# Patient Record
Sex: Female | Born: 2000 | Race: Black or African American | Hispanic: No | Marital: Single | Smoking: Never smoker
Health system: Southern US, Community
[De-identification: ages and names within clinical notes are randomized; demographics above are authoritative.]

## PROBLEM LIST (undated history)

## (undated) DIAGNOSIS — J302 Other seasonal allergic rhinitis: Secondary | ICD-10-CM

## (undated) DIAGNOSIS — J45990 Exercise induced bronchospasm: Secondary | ICD-10-CM

---
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
## 2022-05-23 DIAGNOSIS — G9349 Other encephalopathy: Secondary | ICD-10-CM

## 2022-05-23 HISTORY — DX: Other encephalopathy: G93.49

## 2024-06-21 ENCOUNTER — Other Ambulatory Visit: Payer: Self-pay

## 2024-06-21 ENCOUNTER — Encounter (HOSPITAL_COMMUNITY): Payer: Self-pay

## 2024-06-21 ENCOUNTER — Emergency Department (HOSPITAL_COMMUNITY)
Admission: EM | Admit: 2024-06-21 | Discharge: 2024-06-21 | Disposition: A | Discharge status: Home / Self Care | Attending: Emergency Medicine | Admitting: Emergency Medicine

## 2024-06-21 DIAGNOSIS — R200 Anesthesia of skin: Secondary | ICD-10-CM | POA: Insufficient documentation

## 2024-06-21 DIAGNOSIS — Z91040 Latex allergy status: Secondary | ICD-10-CM | POA: Insufficient documentation

## 2024-06-21 NOTE — ED Provider Notes (Signed)
 " [facility] AT [facility] Provider Note   CSN: [id number] Arrival date & time: 06/21/24  8057     Patient presents with: Numbness   Barbara Collier is a 24 y.o. female.   HPI   Patient has a history of asthma and Susak syndrome.  Patient has been followed by neurology at [facility].  Patient has MRIs confirming her condition.  She has been treated with steroids and IVIG treatment.  Patient states she has had 2 episodes of having numbness on the left side.  Patient states initially was her left arm.  She subsequently had another episode that involved the left arm face and then also down into her leg.  The symptoms have all resolved at this point.  Patient does not have any difficulty walking.  No headache.  No fevers or chills  Prior to Admission medications  Medication Sig Start Date End Date Taking? Authorizing Provider  albuterol  (PROVENTIL  HFA;VENTOLIN  HFA) 108 (90 BASE) MCG/ACT inhaler Inhale 2 puffs into the lungs 4 (four) times daily. 06/21/14   Sonda Alm BROCKS, MD  ibuprofen  (ADVIL ,MOTRIN ) 400 MG tablet Take 1 tablet (400 mg total) by mouth every 6 (six) hours as needed for fever. 08/07/11   Mortenson, Ashley, MD  lamoTRIgine (LAMICTAL) 25 MG tablet Take 25 mg by mouth daily.    [provider]  naproxen  (NAPROSYN ) 500 MG tablet Take 1 tablet (500 mg total) by mouth 2 (two) times daily. 06/21/14   Sonda Alm BROCKS, MD  predniSONE  (DELTASONE ) 20 MG tablet Take 1 tablet (20 mg total) by mouth 2 (two) times daily. Patient not taking: Reported on 12/19/2015 06/21/14   Sonda Alm BROCKS, MD  Pseudoephedrine -Guaifenesin  (MUCINEX  D) [phone] MG TB12 Take 1 tablet by mouth 2 (two) times daily. Patient not taking: Reported on 12/19/2015 08/07/11   Mortenson, Ashley, MD  traMADol  (ULTRAM ) 50 MG tablet 1 to 2 tablets every 8 hours as needed for cough. 06/21/14   Sonda Alm BROCKS, MD    Allergies: Latex    Review of Systems  Updated Vital Signs BP 122/70  (BP Location: Left Arm)   Pulse 77   Temp 99.3 F (37.4 C) (Oral)   Resp 19   Ht 1.6 m (5' 3)   Wt 79.4 kg   SpO2 99%   BMI 31.00 kg/m   Physical Exam Vitals and nursing note reviewed.  Constitutional:      General: She is not in acute distress.    Appearance: She is well-developed.  HENT:     Head: Normocephalic and atraumatic.     Right Ear: External ear normal.     Left Ear: External ear normal.  Eyes:     General: No visual field deficit or scleral icterus.       Right eye: No discharge.        Left eye: No discharge.     Conjunctiva/sclera: Conjunctivae normal.  Neck:     Trachea: No tracheal deviation.  Cardiovascular:     Rate and Rhythm: Normal rate and regular rhythm.  Pulmonary:     Effort: Pulmonary effort is normal. No respiratory distress.     Breath sounds: Normal breath sounds. No stridor. No wheezing or rales.  Abdominal:     General: Bowel sounds are normal. There is no distension.     Palpations: Abdomen is soft.     Tenderness: There is no abdominal tenderness. There is no guarding or rebound.  Musculoskeletal:  General: No tenderness or deformity.     Cervical back: Neck supple.  Skin:    General: Skin is warm and dry.     Findings: No rash.  Neurological:     General: No focal deficit present.     Mental Status: She is alert and oriented to person, place, and time.     Cranial Nerves: No cranial nerve deficit, dysarthria or facial asymmetry.     Sensory: No sensory deficit.     Motor: No abnormal muscle tone, seizure activity or pronator drift.     Coordination: Coordination normal.     Comments:  able to hold both legs off bed for 5 seconds, sensation intact in all extremities,  no left or right sided neglect, normal finger-nose exam bilaterally, no nystagmus noted   Psychiatric:        Mood and Affect: Mood normal.     (all labs ordered are listed, but only abnormal results are displayed) Labs Reviewed - No data to  display  EKG: None  Radiology: No results found.   Procedures   Medications Ordered in the ED - No data to display                                  Medical Decision Making  Patient has history of Susak syndrome.  With her complaints of numbness and tingling on the left side was concerned about the possibility of exacerbation of her condition.  I discussed with the patient about proceeding with MRI and laboratory testing for further evaluation.  Patient informed me that she was feeling better and did not want to have any further testing done.  Explained to her that if she changes her mind we certainly can proceed with lab testing and MRI today.     Final diagnoses:  Left sided numbness    ED Discharge Orders     None          Randol Simmonds, MD 06/21/24 2132  "

## 2024-06-21 NOTE — ED Triage Notes (Signed)
 Pt BIB [facility] from home c/o dizziness, and numbness, tingling, and loss of sensation in her left hand and arm x1 day. Per EMS they believe she has had three TIA's since 2100 last night. 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 Pt A&Ox4 and denies pain.   Hx: strokes and TIAs since 2024   EMS vitals  117/80  82 HR  100% sp02 room air   146 cbg

## 2024-06-21 NOTE — Discharge Instructions (Signed)
 Please return to the hospital if you change your mind about having the MRI and further testing done.  Follow-up with your neurologist to be rechecked
